# Patient Record
Sex: Female | Born: 1980 | Race: White | Hispanic: No | Marital: Single | Smoking: Former smoker
Health system: Southern US, Community
[De-identification: ages and names within clinical notes are randomized; demographics above are authoritative.]

## PROBLEM LIST (undated history)

## (undated) HISTORY — PX: TONSILLECTOMY: SUR1361

---

## 2013-09-17 ENCOUNTER — Ambulatory Visit: Payer: Self-pay | Admitting: Family Medicine

## 2013-09-17 DIAGNOSIS — R42 Dizziness and giddiness: Secondary | ICD-10-CM

## 2013-09-17 LAB — CBC WITH DIFFERENTIAL/PLATELET
Basophil #: 0.1 10*3/uL (ref 0.0–0.1)
Basophil %: 0.5 %
Eosinophil #: 0.2 10*3/uL (ref 0.0–0.7)
Eosinophil %: 1.4 %
HCT: 44.2 % (ref 35.0–47.0)
HGB: 14.9 g/dL (ref 12.0–16.0)
Lymphocyte #: 1.4 10*3/uL (ref 1.0–3.6)
Lymphocyte %: 10.4 %
MCH: 28.9 pg (ref 26.0–34.0)
MCHC: 33.6 g/dL (ref 32.0–36.0)
MCV: 86 fL (ref 80–100)
Monocyte #: 0.9 x10 3/mm (ref 0.2–0.9)
Monocyte %: 6.7 %
Neutrophil #: 10.7 10*3/uL — ABNORMAL HIGH (ref 1.4–6.5)
Neutrophil %: 81 %
Platelet: 252 10*3/uL (ref 150–440)
RBC: 5.14 10*6/uL (ref 3.80–5.20)
RDW: 13.6 % (ref 11.5–14.5)
WBC: 13.2 10*3/uL — ABNORMAL HIGH (ref 3.6–11.0)

## 2013-09-17 LAB — COMPREHENSIVE METABOLIC PANEL
Albumin: 4.3 g/dL (ref 3.4–5.0)
Alkaline Phosphatase: 97 U/L
Anion Gap: 10 (ref 7–16)
BUN: 9 mg/dL (ref 7–18)
Bilirubin,Total: 0.4 mg/dL (ref 0.2–1.0)
Calcium, Total: 9.6 mg/dL (ref 8.5–10.1)
Chloride: 103 mmol/L (ref 98–107)
Co2: 29 mmol/L (ref 21–32)
Creatinine: 0.85 mg/dL (ref 0.60–1.30)
EGFR (African American): >60
EGFR (Non-African Amer.): >60
Glucose: 85 mg/dL (ref 65–99)
Osmolality: 281 (ref 275–301)
Potassium: 3.4 mmol/L — ABNORMAL LOW (ref 3.5–5.1)
SGOT(AST): 27 U/L (ref 15–37)
SGPT (ALT): 69 U/L (ref 12–78)
Sodium: 142 mmol/L (ref 136–145)
Total Protein: 8.1 g/dL (ref 6.4–8.2)

## 2013-09-17 LAB — URINALYSIS, COMPLETE
Bilirubin,UR: NEGATIVE
Leukocyte Esterase: NEGATIVE
Nitrite: NEGATIVE
Specific Gravity: 1.015 (ref 1.003–1.030)

## 2013-09-17 LAB — PREGNANCY, URINE: Pregnancy Test, Urine: NEGATIVE m[IU]/mL

## 2014-08-15 ENCOUNTER — Ambulatory Visit: Payer: Self-pay

## 2015-06-12 ENCOUNTER — Ambulatory Visit
Admission: EM | Admit: 2015-06-12 | Discharge: 2015-06-12 | Disposition: A | Discharge status: Home / Self Care | Payer: BC Managed Care – PPO | Attending: Family Medicine | Admitting: Family Medicine

## 2015-06-12 ENCOUNTER — Ambulatory Visit: Payer: BC Managed Care – PPO

## 2015-06-12 DIAGNOSIS — T300 Burn of unspecified body region, unspecified degree: Secondary | ICD-10-CM

## 2015-06-12 DIAGNOSIS — S299XXA Unspecified injury of thorax, initial encounter: Secondary | ICD-10-CM | POA: Insufficient documentation

## 2015-06-12 DIAGNOSIS — S199XXA Unspecified injury of neck, initial encounter: Secondary | ICD-10-CM | POA: Diagnosis present

## 2015-06-12 DIAGNOSIS — S20219A Contusion of unspecified front wall of thorax, initial encounter: Secondary | ICD-10-CM | POA: Diagnosis not present

## 2015-06-12 DIAGNOSIS — S59912A Unspecified injury of left forearm, initial encounter: Secondary | ICD-10-CM | POA: Diagnosis present

## 2015-06-12 MED ORDER — METAXALONE 800 MG PO TABS: 800.0000 mg | ORAL_TABLET | Freq: Three times a day (TID) | ORAL | Status: AC

## 2015-06-12 MED ORDER — MUPIROCIN 2 % EX OINT: 1.0000 "application " | TOPICAL_OINTMENT | Freq: Three times a day (TID) | CUTANEOUS | Status: AC | PRN

## 2015-06-12 MED ORDER — MELOXICAM 15 MG PO TABS: 15.0000 mg | ORAL_TABLET | Freq: Every day | ORAL | Status: AC
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
## 2016-08-15 ENCOUNTER — Ambulatory Visit: Payer: No Typology Code available for payment source | Admitting: Gynecology
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
